# Patient Record
Sex: Male | Born: 2000 | Race: Black or African American | Hispanic: No | Marital: Single | Smoking: Never smoker
Health system: Southern US, Community
[De-identification: ages and names within clinical notes are randomized; demographics above are authoritative.]

## PROBLEM LIST (undated history)

## (undated) DIAGNOSIS — J45909 Unspecified asthma, uncomplicated: Secondary | ICD-10-CM

## (undated) HISTORY — PX: SMALL INTESTINE SURGERY: SHX150

---
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
## 2023-09-02 ENCOUNTER — Other Ambulatory Visit: Payer: Self-pay

## 2023-09-02 ENCOUNTER — Encounter: Payer: Self-pay | Admitting: Emergency Medicine

## 2023-09-02 ENCOUNTER — Emergency Department
Admission: EM | Admit: 2023-09-02 | Discharge: 2023-09-02 | Disposition: A | Discharge status: Home / Self Care | Attending: Emergency Medicine | Admitting: Emergency Medicine

## 2023-09-02 ENCOUNTER — Emergency Department

## 2023-09-02 DIAGNOSIS — R519 Headache, unspecified: Secondary | ICD-10-CM | POA: Diagnosis not present

## 2023-09-02 DIAGNOSIS — Y92410 Unspecified street and highway as the place of occurrence of the external cause: Secondary | ICD-10-CM | POA: Diagnosis not present

## 2023-09-02 DIAGNOSIS — S161XXA Strain of muscle, fascia and tendon at neck level, initial encounter: Secondary | ICD-10-CM | POA: Diagnosis not present

## 2023-09-02 DIAGNOSIS — M542 Cervicalgia: Secondary | ICD-10-CM | POA: Diagnosis present

## 2023-09-02 MED ORDER — IBUPROFEN 800 MG PO TABS
800.0000 mg | ORAL_TABLET | Freq: Once | ORAL | Status: AC
  Administered 2023-09-02: 800 mg via ORAL
  Filled 2023-09-02: qty 1

## 2023-09-02 MED ORDER — TRAMADOL HCL 50 MG PO TABS: 50.0000 mg | ORAL_TABLET | Freq: Four times a day (QID) | ORAL | 0 refills | Status: DC | PRN

## 2023-09-02 MED ORDER — MELOXICAM 15 MG PO TABS: 15.0000 mg | ORAL_TABLET | Freq: Every day | ORAL | 0 refills | Status: AC

## 2023-09-02 MED ORDER — HYDROCODONE-ACETAMINOPHEN 5-325 MG PO TABS: 1.0000 | ORAL_TABLET | Freq: Once | ORAL | Status: DC
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
## 2023-09-02 NOTE — ED Notes (Signed)
 Patient transported to CT
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
